# Patient Record
Sex: Male | Born: 1975 | Race: White | Hispanic: No | Marital: Single | Smoking: Never smoker
Health system: Southern US, Community
[De-identification: ages and names within clinical notes are randomized; demographics above are authoritative.]

## PROBLEM LIST (undated history)

## (undated) DIAGNOSIS — A630 Anogenital (venereal) warts: Secondary | ICD-10-CM

## (undated) DIAGNOSIS — I10 Essential (primary) hypertension: Secondary | ICD-10-CM

## (undated) DIAGNOSIS — E785 Hyperlipidemia, unspecified: Secondary | ICD-10-CM

## (undated) HISTORY — DX: Anogenital (venereal) warts: A63.0

## (undated) HISTORY — PX: CHOLECYSTECTOMY: SHX55

## (undated) HISTORY — DX: Essential (primary) hypertension: I10

## (undated) HISTORY — DX: Hyperlipidemia, unspecified: E78.5

---

## 2004-06-07 ENCOUNTER — Ambulatory Visit (HOSPITAL_COMMUNITY): Admission: RE | Admit: 2004-06-07 | Discharge: 2004-06-07 | Payer: Self-pay | Admitting: Orthopedic Surgery

## 2006-01-31 IMAGING — CR DG ORBITS FOR FOREIGN BODY
2 series · 2 of 2 positions shown · non-contrast
Comparison: none

CLINICAL DATA: history of working around metal.  Patient for MRI on 06/08/04
 ORBITS FOR FOREIGN BODY
 Two views of the orbits remain and show no evidence of metallic foreign body in the region of  the orbits or sinuses.
 IMPRESSION
 No metallic foreign body.

[view not recorded (1 of 2)]
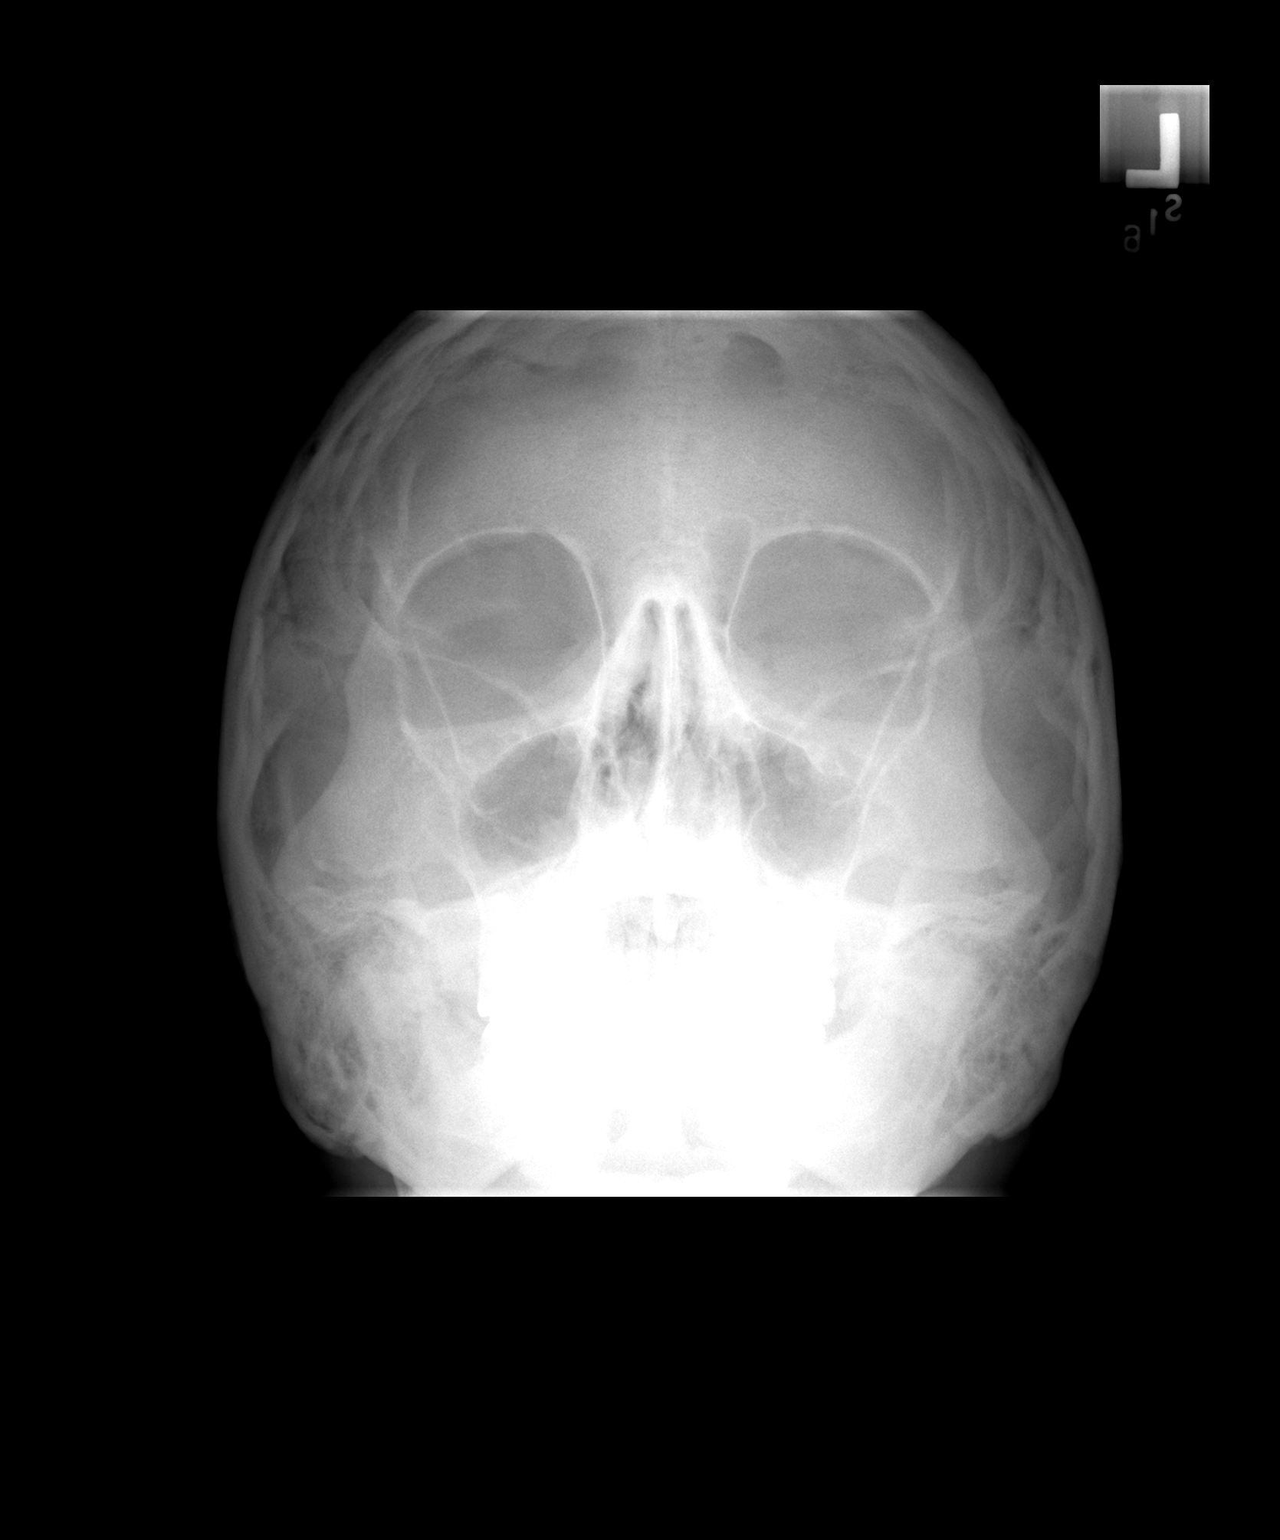

[view not recorded (2 of 2)]
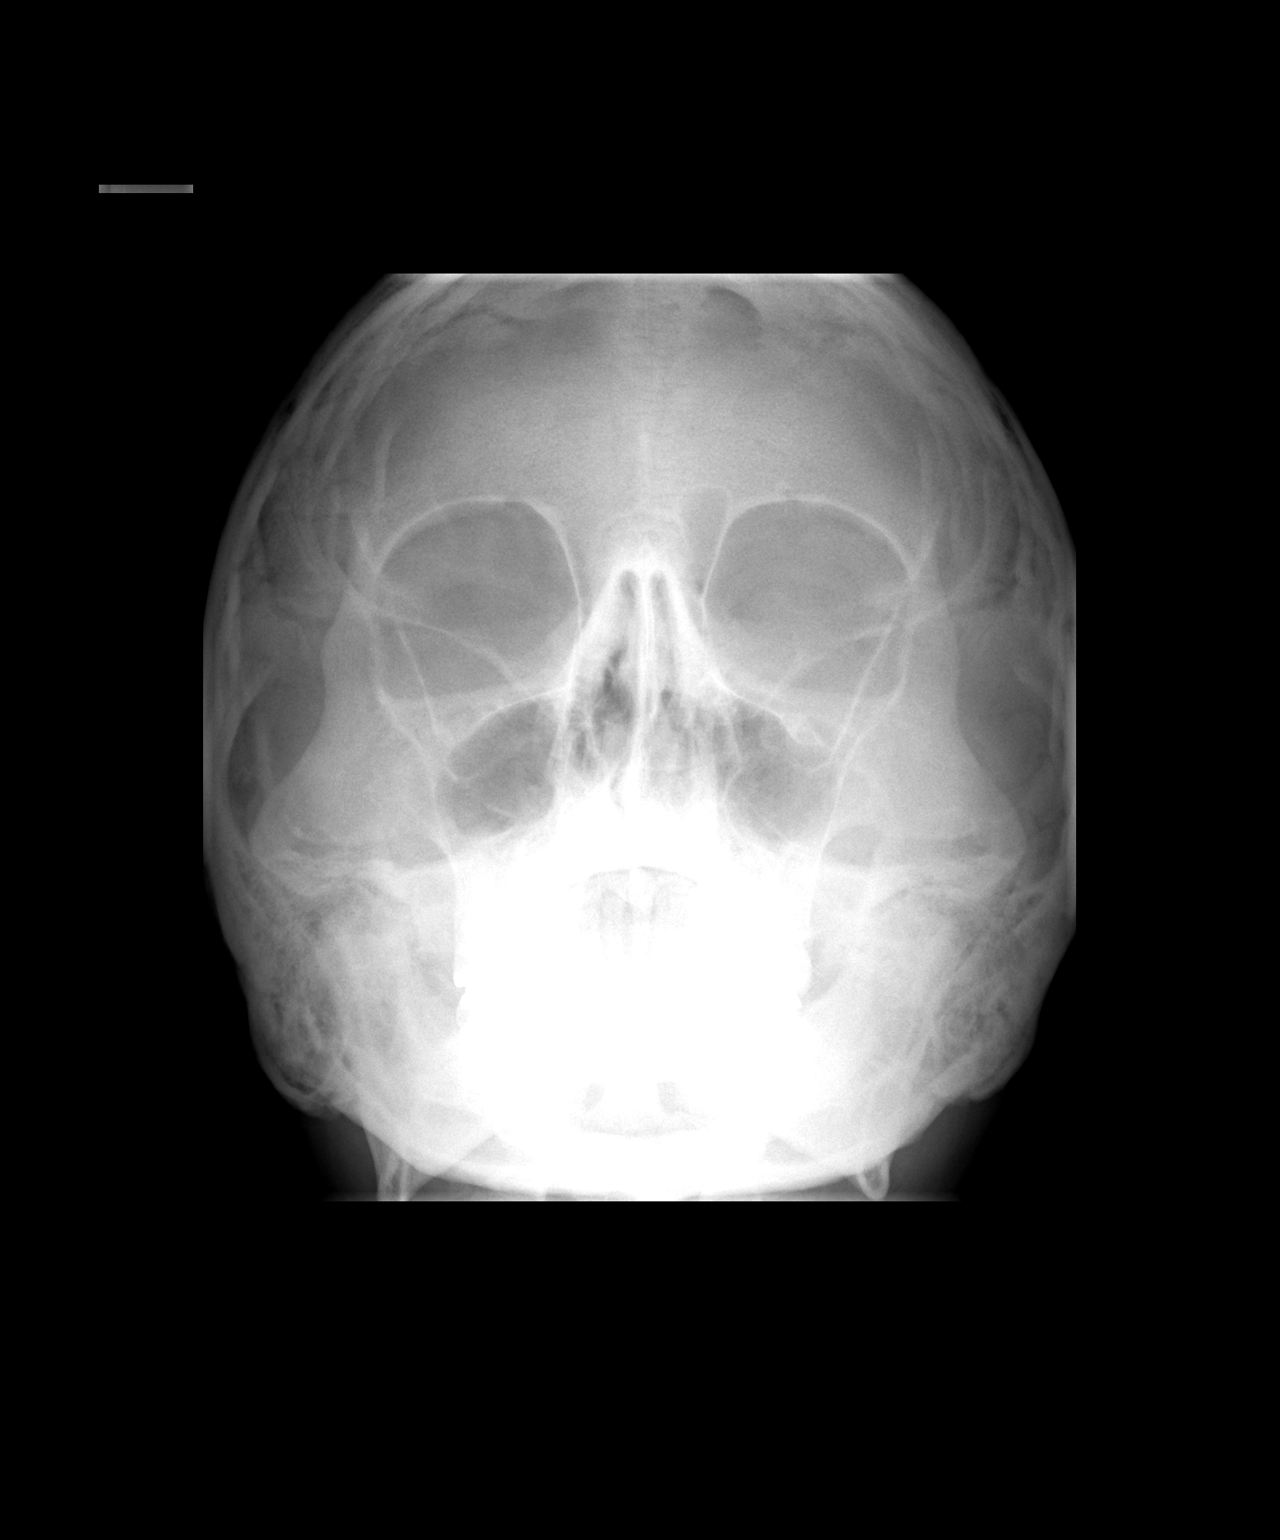

[2 of 2 positions shown; findings below may reference images not displayed]

## 2006-03-03 ENCOUNTER — Ambulatory Visit: Payer: Self-pay | Admitting: Family Medicine

## 2009-03-29 ENCOUNTER — Ambulatory Visit: Payer: Self-pay | Admitting: Family Medicine

## 2009-11-06 ENCOUNTER — Ambulatory Visit: Payer: Self-pay | Admitting: Family Medicine

## 2013-01-06 ENCOUNTER — Other Ambulatory Visit: Payer: Self-pay | Admitting: Family Medicine

## 2013-01-06 MED ORDER — TRIAMCINOLONE ACETONIDE 0.1 % MT PSTE: PASTE | OROMUCOSAL | Status: DC

## 2013-01-06 MED ORDER — DICLOFENAC SODIUM 1 % TD GEL: 4.0000 g | Freq: Four times a day (QID) | TRANSDERMAL | Status: DC

## 2013-09-01 ENCOUNTER — Encounter: Payer: Self-pay | Admitting: Family Medicine

## 2013-09-01 ENCOUNTER — Encounter (INDEPENDENT_AMBULATORY_CARE_PROVIDER_SITE_OTHER): Payer: Self-pay

## 2013-09-01 ENCOUNTER — Ambulatory Visit (INDEPENDENT_AMBULATORY_CARE_PROVIDER_SITE_OTHER): Payer: Private Health Insurance - Indemnity | Admitting: Family Medicine

## 2013-09-01 VITALS — BP 115/75 | HR 80 | Ht 67.0 in | Wt 176.0 lb

## 2013-09-01 DIAGNOSIS — N452 Orchitis: Secondary | ICD-10-CM

## 2013-09-01 DIAGNOSIS — N453 Epididymo-orchitis: Secondary | ICD-10-CM

## 2013-09-01 MED ORDER — DOXYCYCLINE HYCLATE 100 MG PO CAPS: 100.0000 mg | ORAL_CAPSULE | Freq: Two times a day (BID) | ORAL | Status: DC

## 2013-09-01 NOTE — Progress Notes (Signed)
   Subjective:    Patient ID: Luis Chase, male    DOB: 11-Nov-1975, 37 y.o.   MRN: [id number]  [name] is here today complaining of right testicular pain.    Testicle Pain The patient's primary symptoms include testicular pain. The patient's pertinent negatives include no genital injury, genital itching, genital lesions, penile discharge, penile pain or scrotal swelling. This is a new problem. The current episode started more than 1 month ago. The problem occurs daily. The problem has been waxing and waning. The pain is medium. Pertinent negatives include no abdominal pain, constipation, dysuria, fever, frequency, hesitancy, painful intercourse or urgency. The testicular pain affects the right testicle. Nothing aggravates the symptoms. He has tried nothing for the symptoms. He is sexually active. He never uses condoms. No, his partner does not have an STD. There is no history of chlamydia, a femoral hernia, gonorrhea, herpes simplex, kidney stones or prostatitis. (Hydrocele )    Review of Systems  Constitutional: Negative for fever.  Gastrointestinal: Negative for abdominal pain and constipation.  Genitourinary: Positive for testicular pain. Negative for dysuria, hesitancy, urgency, frequency, discharge, scrotal swelling and penile pain.  All other systems reviewed and are negative.     Past Medical History  Diagnosis Date  . Hyperlipidemia   . 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 Hypertension Father   . Hyperlipidemia Father   . Diabetes Maternal Uncle   . Diabetes Paternal Aunt   . Heart disease Paternal Grandmother   . Diabetes Paternal Grandfather   . Cancer Paternal Grandfather     Prostate Cancer  . Cancer Mother 23  . Heart disease Maternal Grandfather      No Known Allergies   Immunization History  Administered Date(s) Administered  . Tdap 05/19/2011      Objective:   Physical Exam  Nursing note and vitals reviewed. Constitutional: He appears well-nourished. No distress.  Abdominal: Hernia confirmed negative in the right inguinal area and confirmed negative in the left inguinal area.  Genitourinary: Penis normal.    Right testis shows tenderness. Left testis shows no tenderness. Circumcised. No penile erythema or penile tenderness. No discharge found.  Lymphadenopathy:       Right: No inguinal adenopathy present.       Left: No inguinal adenopathy present.      Assessment & Plan:    [name] was seen today for testicle pain.  Diagnoses and associated orders for this visit:  Epididymitis - doxycycline (VIBRAMYCIN) 100 MG capsule; Take 1 capsule (100 mg total) by mouth 2 (two) times daily.

## 2013-09-01 NOTE — Patient Instructions (Addendum)
1)  Testicular Pain - Take the doxycycline 100 mg twice a day for 10 days.  If this discomfort continues, contact Dr. Patsi Sears or another urologist at [facility] in [place].   Orchitis Orchitis is an infection of the testicle of usually sudden onset (happens quickly). It may be viral or bacterial (caused by germs). Usually with this illness there is generalized malaise (not feeling well) and fever. There is also pain. There is usually tenderness and swelling of the scrotum and testicle. DIAGNOSIS  Your caregiver will perform an exam to make sure there is not another reason for the pain in your testicle. A rectal exam may be done to find out if the prostate is swollen and tender. Blood work may be done to see if your white blood cell count is elevated. This can help determine if an infection is viral or bacterial. A urinalysis can also determine what type of infection is present. Most bacterial infections can be treated with antibiotics (medications which kill germs). LET YOUR CAREGIVER KNOW ABOUT:  Allergies.  Medications taken including herbs, eye drops, over the counter medications, and creams.  Use of steroids (by mouth or creams).  Previous problems with anesthetics or novocaine.  Previous prostate infections.  History of blood clots (thrombophlebitis).  History of bleeding or blood problems.  Previous surgery.  Previous urinary tract infection.  Other health problems. HOME CARE INSTRUCTIONS   Apply cold packs to the scrotal area for twenty minutes, four times per day or as needed.  A scrotal support may be helpful. Keep a small pillow or support under your testicles while lying or sitting down.  Only take over-the-counter or prescription medicines for pain, discomfort, or fever as directed by your caregiver.  Take all medications, including antibiotics, as directed. Take the antibiotics for the full prescribed length of time even if you are feeling better. SEEK IMMEDIATE  MEDICAL CARE IF:   Your redness, swelling, or pain in the testicle increases or is not getting better.  You have a fever.  You have pain not relieved with medicines.  You have any worsening of any symptoms (problems) that originally brought you in for medical care. Document Released: 09/12/2000 Document Revised: 12/08/2011 Document Reviewed: 09/15/2005 [facility] Patient Information 2014 [place], [place].                                                                                       n

## 2013-11-20 DIAGNOSIS — N452 Orchitis: Secondary | ICD-10-CM | POA: Insufficient documentation

## 2013-11-20 DIAGNOSIS — N451 Epididymitis: Secondary | ICD-10-CM | POA: Insufficient documentation

## 2013-12-22 ENCOUNTER — Other Ambulatory Visit: Payer: Self-pay | Admitting: *Deleted

## 2013-12-22 DIAGNOSIS — Z0000 Encounter for general adult medical examination without abnormal findings: Secondary | ICD-10-CM

## 2013-12-23 ENCOUNTER — Other Ambulatory Visit: Payer: Private Health Insurance - Indemnity

## 2013-12-23 LAB — COMPLETE METABOLIC PANEL WITH GFR
ALT: 21 U/L (ref 0–53)
AST: 17 U/L (ref 0–37)
Albumin: 4.5 g/dL (ref 3.5–5.2)
Alkaline Phosphatase: 57 U/L (ref 39–117)
BUN: 14 mg/dL (ref 6–23)
CO2: 26 mEq/L (ref 19–32)
Calcium: 9.3 mg/dL (ref 8.4–10.5)
Chloride: 102 mEq/L (ref 96–112)
Creat: 0.99 mg/dL (ref 0.50–1.35)
GFR, Est African American: >89 mL/min
GFR, Est Non African American: >89 mL/min
Glucose, Bld: 94 mg/dL (ref 70–99)
Potassium: 4.4 mEq/L (ref 3.5–5.3)
Sodium: 138 mEq/L (ref 135–145)
Total Bilirubin: 0.8 mg/dL (ref 0.2–1.2)
Total Protein: 7.3 g/dL (ref 6.0–8.3)

## 2013-12-23 LAB — CBC WITH DIFFERENTIAL/PLATELET
Basophils Absolute: 0.1 10*3/uL (ref 0.0–0.1)
Basophils Relative: 1 % (ref 0–1)
Eosinophils Absolute: 0.2 10*3/uL (ref 0.0–0.7)
Eosinophils Relative: 4 % (ref 0–5)
HCT: 45.5 % (ref 39.0–52.0)
Hemoglobin: 16.3 g/dL (ref 13.0–17.0)
Lymphocytes Relative: 33 % (ref 12–46)
Lymphs Abs: 1.8 10*3/uL (ref 0.7–4.0)
MCH: 31.9 pg (ref 26.0–34.0)
MCHC: 35.8 g/dL (ref 30.0–36.0)
MCV: 89 fL (ref 78.0–100.0)
Monocytes Absolute: 0.5 10*3/uL (ref 0.1–1.0)
Monocytes Relative: 9 % (ref 3–12)
Neutro Abs: 3 10*3/uL (ref 1.7–7.7)
Neutrophils Relative %: 53 % (ref 43–77)
Platelets: 171 10*3/uL (ref 150–400)
RBC: 5.11 MIL/uL (ref 4.22–5.81)
RDW: 12.7 % (ref 11.5–15.5)
WBC: 5.6 10*3/uL (ref 4.0–10.5)

## 2013-12-23 LAB — LIPID PANEL
Cholesterol: 183 mg/dL (ref 0–200)
HDL: 37 mg/dL — ABNORMAL LOW (ref >39)
LDL Cholesterol: 104 mg/dL — ABNORMAL HIGH (ref 0–99)
Total CHOL/HDL Ratio: 4.9 Ratio
Triglycerides: 209 mg/dL — ABNORMAL HIGH (ref <150)
VLDL: 42 mg/dL — ABNORMAL HIGH (ref 0–40)

## 2013-12-23 LAB — TSH: TSH: 1.219 u[IU]/mL (ref 0.350–4.500)

## 2013-12-24 LAB — PSA, TOTAL AND FREE
PSA, Free Pct: 38 % (ref >25)
PSA, Free: 0.32 ng/mL
PSA: 0.84 ng/mL (ref <=4.00)

## 2014-01-19 ENCOUNTER — Ambulatory Visit (INDEPENDENT_AMBULATORY_CARE_PROVIDER_SITE_OTHER): Payer: Private Health Insurance - Indemnity | Admitting: Family Medicine

## 2014-01-19 ENCOUNTER — Encounter: Payer: Self-pay | Admitting: Family Medicine

## 2014-01-19 VITALS — BP 129/97 | HR 91 | Resp 16 | Wt 180.0 lb

## 2014-01-19 DIAGNOSIS — R030 Elevated blood-pressure reading, without diagnosis of hypertension: Secondary | ICD-10-CM

## 2014-01-19 DIAGNOSIS — E785 Hyperlipidemia, unspecified: Secondary | ICD-10-CM

## 2014-01-19 DIAGNOSIS — R41840 Attention and concentration deficit: Secondary | ICD-10-CM

## 2014-01-19 DIAGNOSIS — IMO0001 Reserved for inherently not codable concepts without codable children: Secondary | ICD-10-CM

## 2014-01-19 NOTE — Patient Instructions (Addendum)
1)  Blood Pressure - Decrease your intake of sodium, alcohol and decongestants.  Check your BP 1-2 x per week.  F/U in a month.    2)  ADD - You scored many 2/3 so we'll try you on Vyvanse when your BP is better.  You may want to try some DHA 1000 mg in the meantime.     DASH Diet The DASH diet stands for "Dietary Approaches to Stop Hypertension." It is a healthy eating plan that has been shown to reduce high blood pressure (hypertension) in as little as 14 days, while also possibly providing other significant health benefits. These other health benefits include reducing the risk of breast cancer after menopause and reducing the risk of type 2 diabetes, heart disease, colon cancer, and stroke. Health benefits also include weight loss and slowing kidney failure in patients with chronic kidney disease.  DIET GUIDELINES  Limit salt (sodium). Your diet should contain less than 1500 mg of sodium daily.  Limit refined or processed carbohydrates. Your diet should include mostly whole grains. Desserts and added sugars should be used sparingly.  Include small amounts of heart-healthy fats. These types of fats include nuts, oils, and tub margarine. Limit saturated and trans fats. These fats have been shown to be harmful in the body. CHOOSING FOODS  The following food groups are based on a 2000 calorie diet. See your Registered Dietitian for individual calorie needs. Grains and Grain Products (6 to 8 servings daily)  Eat More Often: Whole-wheat bread, brown rice, whole-grain or wheat pasta, quinoa, popcorn without added fat or salt (air popped).  Eat Less Often: White bread, white pasta, white rice, cornbread. Vegetables (4 to 5 servings daily)  Eat More Often: Fresh, frozen, and canned vegetables. Vegetables may be raw, steamed, roasted, or grilled with a minimal amount of fat.  Eat Less Often/Avoid: Creamed or fried vegetables. Vegetables in a cheese sauce. Fruit (4 to 5 servings daily)  Eat More  Often: All fresh, canned (in natural juice), or frozen fruits. Dried fruits without added sugar. One hundred percent fruit juice ( cup [237 mL] daily).  Eat Less Often: Dried fruits with added sugar. Canned fruit in light or heavy syrup. [employer], Fish, and Poultry (2 servings or less daily. One serving is 3 to 4 oz [85-114 g]).  Eat More Often: Ninety percent or leaner ground beef, tenderloin, sirloin. Round cuts of beef, chicken breast, [place] breast. All fish. Grill, bake, or broil your meat. Nothing should be fried.  Eat Less Often/Avoid: Fatty cuts of meat, [place], or chicken leg, thigh, or wing. Fried cuts of meat or fish. Dairy (2 to 3 servings)  Eat More Often: Low-fat or fat-free milk, low-fat plain or light yogurt, reduced-fat or part-skim cheese.  Eat Less Often/Avoid: Milk (whole, 2%).Whole milk yogurt. Full-fat cheeses. Nuts, Seeds, and Legumes (4 to 5 servings per week)  Eat More Often: All without added salt.  Eat Less Often/Avoid: Salted nuts and seeds, canned beans with added salt. Fats and Sweets (limited)  Eat More Often: Vegetable oils, tub margarines without trans fats, sugar-free gelatin. Mayonnaise and salad dressings.  Eat Less Often/Avoid: Coconut oils, palm oils, butter, stick margarine, cream, half and half, cookies, candy, pie. FOR MORE INFORMATION The Dash Diet Eating Plan: www.dashdiet.org Document Released: 09/04/2011 Document Revised: 12/08/2011 Document Reviewed: 09/04/2011 [facility] Patient Information 2014 [place], [place].   Attention Deficit Hyperactivity Disorder Attention deficit hyperactivity disorder (ADHD) is a problem with behavior issues based on the way the brain  functions (neurobehavioral disorder). It is a common reason for behavior and academic problems in school. SYMPTOMS  There are 3 types of ADHD. The 3 types and some of the symptoms include:  Inattentive  Gets bored or distracted easily.  Loses or forgets things. Forgets to  hand in homework.  Has trouble organizing or completing tasks.  Difficulty staying on task.  An inability to organize daily tasks and school work.  Leaving projects, chores, or homework unfinished.  Trouble paying attention or responding to details. Careless mistakes.  Difficulty following directions. Often seems like is not listening.  Dislikes activities that require sustained attention (like chores or homework).  Hyperactive-impulsive  Feels like it is impossible to sit still or stay in a seat. Fidgeting with hands and feet.  Trouble waiting turn.  Talking too much or out of turn. Interruptive.  Speaks or acts impulsively.  Aggressive, disruptive behavior.  Constantly busy or on the go, noisy.  Often leaves seat when they are expected to remain seated.  Often runs or climbs where it is not appropriate, or feels very restless.  Combined  Has symptoms of both of the above. Often children with ADHD feel discouraged about themselves and with school. They often perform well below their abilities in school. As children get older, the excess motor activities can calm down, but the problems with paying attention and staying organized persist. Most children do not outgrow ADHD but with good treatment can learn to cope with the symptoms. DIAGNOSIS  When ADHD is suspected, the diagnosis should be made by professionals trained in ADHD. This professional will collect information about the individual suspected of having ADHD. Information must be collected from various settings where the person lives, works, or attends school.  Diagnosis will include:  Confirming symptoms began in childhood.  Ruling out other reasons for the child's behavior.  The health care providers will check with the child's school and check their medical records.  They will talk to teachers and parents.  Behavior rating scales for the child will be filled out by those dealing with the child on a daily  basis. A diagnosis is made only after all information has been considered. TREATMENT  Treatment usually includes behavioral treatment, tutoring or extra support in school, and stimulant medicines. Because of the way a person's brain works with ADHD, these medicines decrease impulsivity and hyperactivity and increase attention. This is different than how they would work in a person who does not have ADHD. Other medicines used include antidepressants and certain blood pressure medicines. Most experts agree that treatment for ADHD should address all aspects of the person's functioning. Along with medicines, treatment should include structured classroom management at school. Parents should reward good behavior, provide constant discipline, and limit-setting. Tutoring should be available for the child as needed. ADHD is a life-long condition. If untreated, the disorder can have long-term serious effects into adolescence and adulthood. HOME CARE INSTRUCTIONS   Often with ADHD there is a lot of frustration among family members dealing with the condition. Blame and anger are also feelings that are common. In many cases, because the problem affects the family as a whole, the entire family may need help. A therapist can help the family find better ways to handle the disruptive behaviors of the person with ADHD and promote change. If the person with ADHD is young, most of the therapist's work is with the parents. Parents will learn techniques for coping with and improving their child's behavior. Sometimes only the child  with the ADHD needs counseling. Your health care providers can help you make these decisions.  Children with ADHD may need help learning how to organize. Some helpful tips include:  Keep routines the same every day from wake-up time to bedtime. Schedule all activities, including homework and playtime. Keep the schedule in a place where the person with ADHD will often see it. Mark schedule changes as  far in advance as possible.  Schedule outdoor and indoor recreation.  Have a place for everything and keep everything in its place. This includes clothing, backpacks, and school supplies.  Encourage writing down assignments and bringing home needed books. Work with your child's teachers for assistance in organizing school work.  Offer your child a well-balanced diet. Breakfast that includes a balance of whole grains, protein and, fruits or vegetables is especially important for school performance. Children should avoid drinks with caffeine including:  Soft drinks.  Coffee.  Tea.  However, some older children (adolescents) may find these drinks helpful in improving their attention. Because it can also be common for adolescents with ADHD to become addicted to caffeine, talk with your health care provider about what is a safe amount of caffeine intake for your child.  Children with ADHD need consistent rules that they can understand and follow. If rules are followed, give small rewards. Children with ADHD often receive, and expect, criticism. Look for good behavior and praise it. Set realistic goals. Give clear instructions. Look for activities that can foster success and self-esteem. Make time for pleasant activities with your child. Give lots of affection.  Parents are their children's greatest advocates. Learn as much as possible about ADHD. This helps you become a stronger and better advocate for your child. It also helps you educate your child's teachers and instructors if they feel inadequate in these areas. Parent support groups are often helpful. A national group with local chapters is called Children and Adults with Attention Deficit Hyperactivity Disorder (CHADD). SEEK MEDICAL CARE IF:  Your child has repeated muscle twitches, cough or speech outbursts.  Your child has sleep problems.  Your child has a marked loss of appetite.  Your child develops depression.  Your child has new  or worsening behavioral problems.  Your child develops dizziness.  Your child has a racing heart.  Your child has stomach pains.  Your child develops headaches. SEEK IMMEDIATE MEDICAL CARE IF:  Your child has been diagnosed with depression or anxiety and the symptoms seem to be getting worse.  Your child has been depressed and suddenly appears to have increased energy or motivation.  You are worried that your child is having a bad reaction to a medication he or she is taking for ADHD. Document Released: 09/05/2002 Document Revised: 07/06/2013 Document Reviewed: 05/23/2013 [facility] Patient Information 2014 [place].

## 2014-01-19 NOTE — Progress Notes (Signed)
Subjective:    Patient ID: Luis Chase, male    DOB: 22-Feb-1976, 38 y.o.   MRN: [id number]  HPI  Luis Chase is here today for follow up on his lab results.  He has done well since his last office visit.  His only concern today is his concentration.    Review of Systems  Constitutional: Negative for fatigue and unexpected weight change.  HENT: Negative.   Respiratory: Negative for shortness of breath.   Cardiovascular: Negative for chest pain, palpitations and leg swelling.  Gastrointestinal: Negative.   Genitourinary: Negative.   Musculoskeletal: Negative for myalgias.  Skin: Negative.   Neurological: Negative.   Psychiatric/Behavioral: Positive for decreased concentration. The patient is not hyperactive.    Past Medical History  Diagnosis Date  . Hyperlipidemia   . 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 Hypertension Father   . Hyperlipidemia Father   . Diabetes Maternal Uncle   . Diabetes Paternal Aunt   . Heart disease Paternal Grandmother   . Diabetes Paternal Grandfather   . Cancer Paternal Grandfather     Prostate Cancer  . Cancer Mother 30  . Heart disease Maternal Grandfather      No current outpatient prescriptions on file prior to visit.   No current facility-administered medications on file prior to visit.     No Known Allergies   Immunization History    Administered Date(s) Administered  . Tdap 05/19/2011       Objective:   Physical Exam  Constitutional: He is oriented to person, place, and time. He appears well-nourished. No distress.  HENT:  Head: Normocephalic.  Eyes: No scleral icterus.  Neck: Neck supple. No thyromegaly present.  Cardiovascular: Normal rate, regular rhythm and normal heart sounds.  Exam reveals no gallop and no friction rub.   No murmur heard. Pulmonary/Chest: Breath sounds normal. No respiratory distress. He exhibits no tenderness.  Musculoskeletal: He exhibits no edema.  Neurological: He is alert and oriented to person, place, and time.  Skin: Skin is warm and dry. No rash noted.  Psychiatric: He has a normal mood and affect. His behavior is normal. Judgment and thought content normal.      Assessment & Plan:    Luis Chase was seen today for concentration.  Diagnoses and associated orders for this visit:  Concentration deficit Comments: He completed the Adult ADD form and scored mostly 3's.  We discussed him trying Vyvanse.  He will need to work on getting his BP lower.    Elevated BP Comments: His BP was high on 2 separate checks.  We discussed ways for him to lower his pressure.  He'll have [name] check it over the next month.    Other and unspecified hyperlipidemia Comments: Overall, his lipid  panel is improved.     TIME SPENT "FACE TO FACE" WITH PATIENT - 30  MINS

## 2014-02-09 ENCOUNTER — Ambulatory Visit (INDEPENDENT_AMBULATORY_CARE_PROVIDER_SITE_OTHER): Payer: Private Health Insurance - Indemnity | Admitting: Family Medicine

## 2014-02-09 ENCOUNTER — Encounter: Payer: Self-pay | Admitting: Family Medicine

## 2014-02-09 VITALS — BP 122/85 | HR 78 | Resp 16 | Ht 68.0 in | Wt 173.0 lb

## 2014-02-09 DIAGNOSIS — R41840 Attention and concentration deficit: Secondary | ICD-10-CM

## 2014-02-09 DIAGNOSIS — Z0000 Encounter for general adult medical examination without abnormal findings: Secondary | ICD-10-CM

## 2014-02-09 LAB — POCT URINALYSIS DIPSTICK
Bilirubin, UA: NEGATIVE
Blood, UA: NEGATIVE
Glucose, UA: NEGATIVE
Ketones, UA: NEGATIVE
Leukocytes, UA: NEGATIVE
Nitrite, UA: NEGATIVE
Protein, UA: NEGATIVE
Spec Grav, UA: <=1.005
Urobilinogen, UA: NEGATIVE
pH, UA: 5

## 2014-02-09 MED ORDER — LISDEXAMFETAMINE DIMESYLATE 50 MG PO CAPS
50.0000 mg | ORAL_CAPSULE | Freq: Every day | ORAL | Status: DC
Start: 2014-02-09 — End: 2014-03-07

## 2014-02-09 NOTE — Progress Notes (Signed)
Subjective:    Patient ID: Luis Chase, male    DOB: 1975-12-19, 38 y.o.   MRN: [id number]  HPI  Luis Chase is here today for a CPE.  He has been doing well since his last visit.   Review of Systems  Constitutional: Negative for activity change, appetite change and fatigue.  Respiratory: Negative for cough.   Cardiovascular: Negative for chest pain, palpitations and leg swelling.  Gastrointestinal: Negative for constipation and anal bleeding.  Psychiatric/Behavioral: Negative for behavioral problems and sleep disturbance. The patient is not nervous/anxious.   All other systems reviewed and are negative.    Past Medical History  Diagnosis Date  . Hyperlipidemia   . 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 Hypertension Father   . Hyperlipidemia Father   . Diabetes Maternal Uncle   . Diabetes Paternal Aunt   . Heart disease Paternal Grandmother   . Diabetes Paternal Grandfather   . Cancer Paternal Grandfather     Prostate   . Cancer Mother 48    Lung (Smoker)   . Heart disease Maternal Grandfather   . Cancer Maternal Grandfather     Prostate    No Known Allergies   Immunization History  Administered Date(s) Administered  . Tdap 05/19/2011       Objective:   Physical Exam  Nursing note and vitals reviewed. Constitutional: He is oriented to person, place, and time. He  appears well-developed and well-nourished. No distress.  HENT:  Head: Normocephalic and atraumatic.  Right Ear: External ear normal.  Left Ear: External ear normal.  Nose: Nose normal.  Mouth/Throat: Oropharynx is clear and moist. No oropharyngeal exudate.  Eyes: Conjunctivae and EOM are normal. Pupils are equal, round, and reactive to light. No scleral icterus.  Neck: Normal range of motion. Neck supple. No thyromegaly present.  Cardiovascular: Normal rate, regular rhythm, normal heart sounds and intact distal pulses.   No murmur heard. Pulmonary/Chest: Effort normal and breath sounds normal.  Abdominal: Soft. Bowel sounds are normal. He exhibits no mass. There is no tenderness. Hernia confirmed negative in the right inguinal area and confirmed negative in the left inguinal area.  Genitourinary: Testes normal and penis normal. Circumcised.  Musculoskeletal: Normal range of motion. He exhibits no edema and no tenderness.  Lymphadenopathy:    He has no cervical adenopathy.       Right: No inguinal adenopathy present.       Left: No inguinal adenopathy present.  Neurological: He is alert and oriented to person, place, and time. He has normal reflexes.  Skin: Skin is warm and dry. No rash noted.  Psychiatric: He has a normal mood and affect. His behavior is normal. Judgment and thought content normal.  Assessment & Plan:    Yassin was seen today for annual exam.  Diagnoses and associated orders for this visit:  Routine general medical examination at a health care facility - POCT urinalysis dipstick  Concentration deficit -        lisdexamfetamine (VYVANSE) 50 MG capsule; Take 1 capsule (50 mg total) by mouth daily.

## 2014-02-09 NOTE — Patient Instructions (Addendum)
1)  Preventative - Complete and return the stool cards.  2)  ADD - Start on 25 mg of Vyvanse for 4 days then increase to 50 mg.  F/U in one month for a recheck.      Preventive Care for Adults, Male A healthy lifestyle and preventive care can promote health and wellness. Preventive health guidelines for men include the following key practices:  A routine yearly physical is a good way to check with your health care provider about your health and preventative screening. It is a chance to share any concerns and updates on your health and to receive a thorough exam.  Visit your dentist for a routine exam and preventative care every 6 months. Brush your teeth twice a day and floss once a day. Good oral hygiene prevents tooth decay and gum disease.  The frequency of eye exams is based on your age, health, family medical history, use of contact lenses, and other factors. Follow your health care provider's recommendations for frequency of eye exams.  Eat a healthy diet. Foods such as vegetables, fruits, whole grains, low-fat dairy products, and lean protein foods contain the nutrients you need without too many calories. Decrease your intake of foods high in solid fats, added sugars, and salt. Eat the right amount of calories for you.Get information about a proper diet from your health care provider, if necessary.  Regular physical exercise is one of the most important things you can do for your health. Most adults should get at least 150 minutes of moderate-intensity exercise (any activity that increases your heart rate and causes you to sweat) each week. In addition, most adults need muscle-strengthening exercises on 2 or more days a week.  Maintain a healthy weight. The body mass index (BMI) is a screening tool to identify possible weight problems. It provides an estimate of body fat based on height and weight. Your health care provider can find your BMI and can help you achieve or maintain a healthy  weight.For adults 20 years and older:  A BMI below 18.5 is considered underweight.  A BMI of 18.5 to 24.9 is normal.  A BMI of 25 to 29.9 is considered overweight.  A BMI of 30 and above is considered obese.  Maintain normal blood lipids and cholesterol levels by exercising and minimizing your intake of saturated fat. Eat a balanced diet with plenty of fruit and vegetables. Blood tests for lipids and cholesterol should begin at age 67 and be repeated every 5 years. If your lipid or cholesterol levels are high, you are over 50, or you are at high risk for heart disease, you may need your cholesterol levels checked more frequently.Ongoing high lipid and cholesterol levels should be treated with medicines if diet and exercise are not working.  If you smoke, find out from your health care provider how to quit. If you do not use tobacco, do not start.  Lung cancer screening is recommended for adults aged 73 80 years who are at high risk for developing lung cancer because of a history of smoking. A yearly low-dose CT scan of the lungs is recommended for people who have at least a 30-pack-year history of smoking and are a current smoker or have quit within the past 15 years. A pack year of smoking is smoking an average of 1 pack of cigarettes a day for 1 year (for example: 1 pack a day for 30 years or 2 packs a day for 15 years). Yearly screening should continue  until the smoker has stopped smoking for at least 15 years. Yearly screening should be stopped for people who develop a health problem that would prevent them from having lung cancer treatment.  If you choose to drink alcohol, do not have more than 2 drinks per day. One drink is considered to be 12 ounces (355 mL) of beer, 5 ounces (148 mL) of wine, or 1.5 ounces (44 mL) of liquor.  Avoid use of street drugs. Do not share needles with anyone. Ask for help if you need support or instructions about stopping the use of drugs.  High blood pressure  causes heart disease and increases the risk of stroke. Your blood pressure should be checked at least every 1 2 years. Ongoing high blood pressure should be treated with medicines, if weight loss and exercise are not effective.  If you are 19 40 years old, ask your health care provider if you should take aspirin to prevent heart disease.  Diabetes screening involves taking a blood sample to check your fasting blood sugar level. This should be done once every 3 years, after age 72, if you are within normal weight and without risk factors for diabetes. Testing should be considered at a younger age or be carried out more frequently if you are overweight and have at least 1 risk factor for diabetes.  Colorectal cancer can be detected and often prevented. Most routine colorectal cancer screening begins at the age of 64 and continues through age 93. However, your health care provider may recommend screening at an earlier age if you have risk factors for colon cancer. On a yearly basis, your health care provider may provide home test kits to check for hidden blood in the stool. Use of a small camera at the end of a tube to directly examine the colon (sigmoidoscopy or colonoscopy) can detect the earliest forms of colorectal cancer. Talk to your health care provider about this at age 5, when routine screening begins. Direct exam of the colon should be repeated every 5 10 years through age 83, unless early forms of precancerous polyps or small growths are found.  People who are at an increased risk for hepatitis B should be screened for this virus. You are considered at high risk for hepatitis B if:  You were born in a country where hepatitis B occurs often. Talk with your health care provider about which countries are considered high-risk.  Your parents were born in a high-risk country and you have not received a shot to protect against hepatitis B (hepatitis B vaccine).  You have HIV or AIDS.  You use  needles to inject street drugs.  You live with, or have sex with, someone who has hepatitis B.  You are a man who has sex with other men (MSM).  You get hemodialysis treatment.  You take certain medicines for conditions such as cancer, organ transplantation, and autoimmune conditions.  Hepatitis C blood testing is recommended for all people born from [birth year] through 1965 and any individual with known risks for hepatitis C.  Practice safe sex. Use condoms and avoid high-risk sexual practices to reduce the spread of sexually transmitted infections (STIs). STIs include gonorrhea, chlamydia, syphilis, trichomonas, herpes, HPV, and human immunodeficiency virus (HIV). Herpes, HIV, and HPV are viral illnesses that have no cure. They can result in disability, cancer, and death.  A one-time screening for abdominal aortic aneurysm (AAA) and surgical repair of large AAAs by ultrasound are recommended for men ages 4 to 55  years who are current or former smokers.  Healthy men should no longer receive prostate-specific antigen (PSA) blood tests as part of routine cancer screening. Talk with your health care provider about prostate cancer screening.  Testicular cancer screening is not recommended for adult males who have no symptoms. Screening includes self-exam, a health care provider exam, and other screening tests. Consult with your health care provider about any symptoms you have or any concerns you have about testicular cancer.  Use sunscreen. Apply sunscreen liberally and repeatedly throughout the day. You should seek shade when your shadow is shorter than you. Protect yourself by wearing long sleeves, pants, a wide-brimmed hat, and sunglasses year round, whenever you are outdoors.  Once a month, do a whole-body skin exam, using a mirror to look at the skin on your back. Tell your health care provider about new moles, moles that have irregular borders, moles that are larger than a pencil eraser, or moles  that have changed in shape or color.  Stay current with required vaccines (immunizations).  Influenza vaccine. All adults should be immunized every year.  Tetanus, diphtheria, and acellular pertussis (Td, Tdap) vaccine. An adult who has not previously received Tdap or who does not know his vaccine status should receive 1 dose of Tdap. This initial dose should be followed by tetanus and diphtheria toxoids (Td) booster doses every 10 years. Adults with an unknown or incomplete history of completing a 3-dose immunization series with Td-containing vaccines should begin or complete a primary immunization series including a Tdap dose. Adults should receive a Td booster every 10 years.  Varicella vaccine. An adult without evidence of immunity to varicella should receive 2 doses or a second dose if he has previously received 1 dose.  Human papillomavirus (HPV) vaccine. Males aged 33 21 years who have not received the vaccine previously should receive the 3-dose series. Males aged 41 26 years may be immunized. Immunization is recommended through the age of 36 years for any male who has sex with males and did not get any or all doses earlier. Immunization is recommended for any person with an immunocompromised condition through the age of 59 years if he did not get any or all doses earlier. During the 3-dose series, the second dose should be obtained 4 8 weeks after the first dose. The third dose should be obtained 24 weeks after the first dose and 16 weeks after the second dose.  Zoster vaccine. One dose is recommended for adults aged 59 years or older unless certain conditions are present.  Measles, mumps, and rubella (MMR) vaccine. Adults born before [birth year] generally are considered immune to measles and mumps. Adults born in [birth year] or later should have 1 or more doses of MMR vaccine unless there is a contraindication to the vaccine or there is laboratory evidence of immunity to each of the three diseases. A  routine second dose of MMR vaccine should be obtained at least 28 days after the first dose for students attending postsecondary schools, health care workers, or international travelers. People who received inactivated measles vaccine or an unknown type of measles vaccine during 1963 1967 should receive 2 doses of MMR vaccine. People who received inactivated mumps vaccine or an unknown type of mumps vaccine before 1979 and are at high risk for mumps infection should consider immunization with 2 doses of MMR vaccine. Unvaccinated health care workers born before [birth year] who lack laboratory evidence of measles, mumps, or rubella immunity or laboratory confirmation of disease should consider  measles and mumps immunization with 2 doses of MMR vaccine or rubella immunization with 1 dose of MMR vaccine.  Pneumococcal 13-valent conjugate (PCV13) vaccine. When indicated, a person who is uncertain of his immunization history and has no record of immunization should receive the PCV13 vaccine. An adult aged 35 years or older who has certain medical conditions and has not been previously immunized should receive 1 dose of PCV13 vaccine. This PCV13 should be followed with a dose of pneumococcal polysaccharide (PPSV23) vaccine. The PPSV23 vaccine dose should be obtained at least 8 weeks after the dose of PCV13 vaccine. An adult aged 14 years or older who has certain medical conditions and previously received 1 or more doses of PPSV23 vaccine should receive 1 dose of PCV13. The PCV13 vaccine dose should be obtained 1 or more years after the last PPSV23 vaccine dose.  Pneumococcal polysaccharide (PPSV23) vaccine. When PCV13 is also indicated, PCV13 should be obtained first. All adults aged 24 years and older should be immunized. An adult younger than age 20 years who has certain medical conditions should be immunized. Any person who resides in a nursing home or long-term care facility should be immunized. An adult smoker should be  immunized. People with an immunocompromised condition and certain other conditions should receive both PCV13 and PPSV23 vaccines. People with human immunodeficiency virus (HIV) infection should be immunized as soon as possible after diagnosis. Immunization during chemotherapy or radiation therapy should be avoided. Routine use of PPSV23 vaccine is not recommended for American Indians, [place] Natives, or people younger than 65 years unless there are medical conditions that require PPSV23 vaccine. When indicated, people who have unknown immunization and have no record of immunization should receive PPSV23 vaccine. One-time revaccination 5 years after the first dose of PPSV23 is recommended for people aged 20 64 years who have chronic kidney failure, nephrotic syndrome, asplenia, or immunocompromised conditions. People who received 1 2 doses of PPSV23 before age 34 years should receive another dose of PPSV23 vaccine at age 11 years or later if at least 5 years have passed since the previous dose. Doses of PPSV23 are not needed for people immunized with PPSV23 at or after age 55 years.  Meningococcal vaccine. Adults with asplenia or persistent complement component deficiencies should receive 2 doses of quadrivalent meningococcal conjugate (MenACWY-D) vaccine. The doses should be obtained at least 2 months apart. Microbiologists working with certain meningococcal bacteria, [place] recruits, people at risk during an outbreak, and people who travel to or live in countries with a high rate of meningitis should be immunized. A first-year college student up through age 57 years who is living in a residence hall should receive a dose if he did not receive a dose on or after his 16th birthday. Adults who have certain high-risk conditions should receive one or more doses of vaccine.  Hepatitis A vaccine. Adults who wish to be protected from this disease, have certain high-risk conditions, work with hepatitis A-infected  animals, work in hepatitis A research labs, or travel to or work in countries with a high rate of hepatitis A should be immunized. Adults who were previously unvaccinated and who anticipate close contact with an international adoptee during the first 60 days after arrival in the [place] from a country with a high rate of hepatitis A should be immunized.  Hepatitis B vaccine. Adults who wish to be protected from this disease, have certain high-risk conditions, may be exposed to blood or other infectious body fluids, are household  contacts or sex partners of hepatitis B positive people, are clients or workers in certain care facilities, or travel to or work in countries with a high rate of hepatitis B should be immunized.  Haemophilus influenzae type b (Hib) vaccine. A previously unvaccinated person with asplenia or sickle cell disease or having a scheduled splenectomy should receive 1 dose of Hib vaccine. Regardless of previous immunization, a recipient of a hematopoietic stem cell transplant should receive a 3-dose series 6 12 months after his successful transplant. Hib vaccine is not recommended for adults with HIV infection. Preventive Service / Frequency Ages 60 to 74  Blood pressure check.** / Every 1 to 2 years.  Lipid and cholesterol check.** / Every 5 years beginning at age 45.  Hepatitis C blood test.** / For any individual with known risks for hepatitis C.  Skin self-exam. / Monthly.  Influenza vaccine. / Every year.  Tetanus, diphtheria, and acellular pertussis (Tdap, Td) vaccine.** / Consult your health care provider. 1 dose of Td every 10 years.  Varicella vaccine.** / Consult your health care provider.  HPV vaccine. / 3 doses over 6 months, if 9 or younger.  Measles, mumps, rubella (MMR) vaccine.** / You need at least 1 dose of MMR if you were born in 1957 or later. You may also need a second dose.  Pneumococcal 13-valent conjugate (PCV13) vaccine.** / Consult your  health care provider.  Pneumococcal polysaccharide (PPSV23) vaccine.** / 1 to 2 doses if you smoke cigarettes or if you have certain conditions.  Meningococcal vaccine.** / 1 dose if you are age 58 to 25 years and a Market researcher living in a residence hall, or have one of several medical conditions. You may also need additional booster doses.  Hepatitis A vaccine.** / Consult your health care provider.  Hepatitis B vaccine.** / Consult your health care provider.  Haemophilus influenzae type b (Hib) vaccine.** / Consult your health care provider. Ages 65 to 62  Blood pressure check.** / Every 1 to 2 years.  Lipid and cholesterol check.** / Every 5 years beginning at age 34.  Lung cancer screening. / Every year if you are aged 52 80 years and have a 30-pack-year history of smoking and currently smoke or have quit within the past 15 years. Yearly screening is stopped once you have quit smoking for at least 15 years or develop a health problem that would prevent you from having lung cancer treatment.  Fecal occult blood test (FOBT) of stool. / Every year beginning at age 63 and continuing until age 63. You may not have to do this test if you get a colonoscopy every 10 years.  Flexible sigmoidoscopy** or colonoscopy.** / Every 5 years for a flexible sigmoidoscopy or every 10 years for a colonoscopy beginning at age 21 and continuing until age 89.  Hepatitis C blood test.** / For all people born from [birth year] through 1965 and any individual with known risks for hepatitis C.  Skin self-exam. / Monthly.  Influenza vaccine. / Every year.  Tetanus, diphtheria, and acellular pertussis (Tdap/Td) vaccine.** / Consult your health care provider. 1 dose of Td every 10 years.  Varicella vaccine.** / Consult your health care provider.  Zoster vaccine.** / 1 dose for adults aged 57 years or older.  Measles, mumps, rubella (MMR) vaccine.** / You need at least 1 dose of MMR if you were born in  1959 or later. You may also need a second dose.  Pneumococcal 13-valent conjugate (PCV13) vaccine.** / Consult  your health care provider.  Pneumococcal polysaccharide (PPSV23) vaccine.** / 1 to 2 doses if you smoke cigarettes or if you have certain conditions.  Meningococcal vaccine.** / Consult your health care provider.  Hepatitis A vaccine.** / Consult your health care provider.  Hepatitis B vaccine.** / Consult your health care provider.  Haemophilus influenzae type b (Hib) vaccine.** / Consult your health care provider. Ages 21 and over  Blood pressure check.** / Every 1 to 2 years.  Lipid and cholesterol check.**/ Every 5 years beginning at age 6.  Lung cancer screening. / Every year if you are aged 10 80 years and have a 30-pack-year history of smoking and currently smoke or have quit within the past 15 years. Yearly screening is stopped once you have quit smoking for at least 15 years or develop a health problem that would prevent you from having lung cancer treatment.  Fecal occult blood test (FOBT) of stool. / Every year beginning at age 107 and continuing until age 30. You may not have to do this test if you get a colonoscopy every 10 years.  Flexible sigmoidoscopy** or colonoscopy.** / Every 5 years for a flexible sigmoidoscopy or every 10 years for a colonoscopy beginning at age 93 and continuing until age 12.  Hepatitis C blood test.** / For all people born from [birth year] through 1967 and any individual with known risks for hepatitis C.  Abdominal aortic aneurysm (AAA) screening.** / A one-time screening for ages 54 to 50 years who are current or former smokers.  Skin self-exam. / Monthly.  Influenza vaccine. / Every year.  Tetanus, diphtheria, and acellular pertussis (Tdap/Td) vaccine.** / 1 dose of Td every 10 years.  Varicella vaccine.** / Consult your health care provider.  Zoster vaccine.** / 1 dose for adults aged 52 years or older.  Pneumococcal 13-valent  conjugate (PCV13) vaccine.** / Consult your health care provider.  Pneumococcal polysaccharide (PPSV23) vaccine.** / 1 dose for all adults aged 55 years and older.  Meningococcal vaccine.** / Consult your health care provider.  Hepatitis A vaccine.** / Consult your health care provider.  Hepatitis B vaccine.** / Consult your health care provider.  Haemophilus influenzae type b (Hib) vaccine.** / Consult your health care provider. **Family history and personal history of risk and conditions may change your health care provider's recommendations. Document Released: 11/11/2001 Document Revised: 07/06/2013 Document Reviewed: 02/10/2011 [facility] Patient Information 2014 [place], [place].

## 2014-03-07 ENCOUNTER — Ambulatory Visit (INDEPENDENT_AMBULATORY_CARE_PROVIDER_SITE_OTHER): Payer: Private Health Insurance - Indemnity | Admitting: Family Medicine

## 2014-03-07 ENCOUNTER — Encounter: Payer: Self-pay | Admitting: Family Medicine

## 2014-03-07 VITALS — BP 121/85 | HR 97 | Resp 16 | Ht 67.0 in | Wt 172.0 lb

## 2014-03-07 DIAGNOSIS — R41840 Attention and concentration deficit: Secondary | ICD-10-CM

## 2014-03-07 MED ORDER — LISDEXAMFETAMINE DIMESYLATE 50 MG PO CAPS: 50.0000 mg | ORAL_CAPSULE | Freq: Every day | ORAL | Status: DC

## 2014-03-07 NOTE — Progress Notes (Signed)
   Subjective:    Patient ID: Luis Chase, male    DOB: [birth year], 38 y.o.   MRN: [id number]  HPI  Luis Chase is here today to follow up on his concentration.  He started taking Vyvanse 50 mg last month and he feels that it has worked very well for him.  He is eating and sleeping well. He would like to continue on the current dosage.    Review of Systems  Constitutional: Negative for activity change, appetite change and fatigue.  Psychiatric/Behavioral: Negative for behavioral problems, sleep disturbance and decreased concentration. The patient is not nervous/anxious.   All other systems reviewed and are negative.    Past Medical History  Diagnosis Date  . Hyperlipidemia   . 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 Hypertension Father   . Hyperlipidemia Father   . Diabetes Maternal Uncle   . Diabetes Paternal Aunt   . Heart disease Paternal Grandmother   . Diabetes Paternal Grandfather   . Cancer Paternal Grandfather     Prostate   . Cancer Mother 63    Lung (Smoker)   . Heart disease Maternal Grandfather   . Cancer Maternal Grandfather     Prostate     No Known Allergies   Immunization History  Administered Date(s) Administered  . Tdap 05/19/2011       Objective:   Physical Exam  Constitutional: He is oriented to person, place, and time. He appears well-developed and  well-nourished. No distress.  Eyes: No scleral icterus.  Neck: No thyromegaly present.  Cardiovascular: Normal rate and regular rhythm.   Pulmonary/Chest: Effort normal and breath sounds normal.  Musculoskeletal: He exhibits no edema and no tenderness.  Neurological: He is alert and oriented to person, place, and time.  Skin: Skin is warm and dry.  Psychiatric: He has a normal mood and affect. His behavior is normal. Judgment and thought content normal.      Assessment & Plan:    Maribel was seen today for medication management.  Diagnoses and associated orders for this visit:  Concentration deficit  - lisdexamfetamine (VYVANSE) 50 MG capsule; Take 1 capsule (50 mg total) by mouth daily. Refilled x 4 months.

## 2014-05-05 ENCOUNTER — Other Ambulatory Visit: Payer: Self-pay | Admitting: Family Medicine

## 2014-05-05 DIAGNOSIS — R41840 Attention and concentration deficit: Secondary | ICD-10-CM

## 2014-05-05 DIAGNOSIS — N3090 Cystitis, unspecified without hematuria: Secondary | ICD-10-CM

## 2014-05-05 MED ORDER — CIPROFLOXACIN HCL 500 MG PO TABS: 500.0000 mg | ORAL_TABLET | Freq: Two times a day (BID) | ORAL | Status: AC

## 2014-05-05 MED ORDER — LISDEXAMFETAMINE DIMESYLATE 50 MG PO CAPS: 50.0000 mg | ORAL_CAPSULE | Freq: Every day | ORAL | Status: AC

## 2014-05-05 MED ORDER — PHENAZOPYRIDINE HCL 200 MG PO TABS: 200.0000 mg | ORAL_TABLET | Freq: Three times a day (TID) | ORAL | Status: AC

## 2020-04-12 ENCOUNTER — Encounter: Payer: Self-pay | Admitting: Gastroenterology

## 2020-06-01 ENCOUNTER — Ambulatory Visit (AMBULATORY_SURGERY_CENTER): Payer: Self-pay | Admitting: *Deleted

## 2020-06-01 ENCOUNTER — Other Ambulatory Visit: Payer: Self-pay

## 2020-06-01 VITALS — Ht 68.0 in | Wt 166.0 lb

## 2020-06-01 DIAGNOSIS — Z1211 Encounter for screening for malignant neoplasm of colon: Secondary | ICD-10-CM

## 2020-06-01 MED ORDER — PEG 3350-KCL-NA BICARB-NACL 420 G PO SOLR: 4000.0000 mL | Freq: Once | ORAL | 0 refills | Status: AC

## 2020-06-01 NOTE — Progress Notes (Signed)

## 2020-06-07 ENCOUNTER — Encounter: Payer: Self-pay | Admitting: Gastroenterology

## 2020-06-22 ENCOUNTER — Encounter: Payer: Self-pay | Admitting: Gastroenterology

## 2020-06-22 ENCOUNTER — Ambulatory Visit (AMBULATORY_SURGERY_CENTER): Payer: BLUE CROSS/BLUE SHIELD | Admitting: Gastroenterology

## 2020-06-22 ENCOUNTER — Other Ambulatory Visit: Payer: Self-pay

## 2020-06-22 VITALS — BP 122/88 | HR 62 | Temp 97.1°F | Resp 9 | Ht 68.0 in | Wt 166.0 lb

## 2020-06-22 DIAGNOSIS — K635 Polyp of colon: Secondary | ICD-10-CM

## 2020-06-22 DIAGNOSIS — D124 Benign neoplasm of descending colon: Secondary | ICD-10-CM

## 2020-06-22 DIAGNOSIS — Z1211 Encounter for screening for malignant neoplasm of colon: Secondary | ICD-10-CM

## 2020-06-22 MED ORDER — SODIUM CHLORIDE 0.9 % IV SOLN: 500.0000 mL | Freq: Once | INTRAVENOUS | Status: DC

## 2020-06-22 NOTE — Progress Notes (Signed)
Pt's states no medical or surgical changes since previsit or office visit.  SF vitals and AG IV.

## 2020-06-22 NOTE — Patient Instructions (Addendum)
Handout was given to you on polyps. You may resume your current medications today. Await biopsy results. Please call if any questions or concerns.      .YOU HAD AN ENDOSCOPIC PROCEDURE TODAY AT [facility]:   Refer to the procedure report that was given to you for any specific questions about what was found during the examination.  If the procedure report does not answer your questions, please call your gastroenterologist to clarify.  If you requested that your care partner not be given the details of your procedure findings, then the procedure report has been included in a sealed envelope for you to review at your convenience later.  YOU SHOULD EXPECT: Some feelings of bloating in the abdomen. Passage of more gas than usual.  Walking can help get rid of the air that was put into your GI tract during the procedure and reduce the bloating. If you had a lower endoscopy (such as a colonoscopy or flexible sigmoidoscopy) you may notice spotting of blood in your stool or on the toilet paper. If you underwent a bowel prep for your procedure, you may not have a normal bowel movement for a few days.  Please Note:  You might notice some irritation and congestion in your nose or some drainage.  This is from the oxygen used during your procedure.  There is no need for concern and it should clear up in a day or so.  SYMPTOMS TO REPORT IMMEDIATELY:   Following lower endoscopy (colonoscopy or flexible sigmoidoscopy):  Excessive amounts of blood in the stool  Significant tenderness or worsening of abdominal pains  Swelling of the abdomen that is new, acute  Fever of 100F or higher   For urgent or emergent issues, a gastroenterologist can be reached at any hour by calling [phone]. Do not use MyChart messaging for urgent concerns.    DIET:  We do recommend a small meal at first, but then you may proceed to your regular diet.  Drink plenty of fluids but you should avoid alcoholic  beverages for 24 hours.  ACTIVITY:  You should plan to take it easy for the rest of today and you should NOT DRIVE or use heavy machinery until tomorrow (because of the sedation medicines used during the test).    FOLLOW UP: Our staff will call the number listed on your records 48-72 hours following your procedure to check on you and address any questions or concerns that you may have regarding the information given to you following your procedure. If we do not reach you, we will leave a message.  We will attempt to reach you two times.  During this call, we will ask if you have developed any symptoms of COVID 19. If you develop any symptoms (ie: fever, flu-like symptoms, shortness of breath, cough etc.) before then, please call [phone].  If you test positive for Covid 19 in the 2 weeks post procedure, please call and report this information to [place].    If any biopsies were taken you will be contacted by phone or by letter within the next 1-3 weeks.  Please call us at [phone] if you have not heard about the biopsies in 3 weeks.    SIGNATURES/CONFIDENTIALITY: You and/or your care partner have signed paperwork which will be entered into your electronic medical record.  These signatures attest to the fact that that the information above on your After Visit Summary has been reviewed and is understood.  Full responsibility of the  confidentiality of this discharge information lies with you and/or your care-partner.

## 2020-06-22 NOTE — Progress Notes (Signed)
To PACU, VSS. Report to [email or web address] 

## 2020-06-22 NOTE — Progress Notes (Signed)
Called to room to assist during endoscopic procedure.  Patient ID and intended procedure confirmed with present staff. Received instructions for my participation in the procedure from the performing physician.  

## 2020-06-22 NOTE — Op Note (Signed)
[facility] Patient Name: Luis Chase Procedure Date: 06/22/2020 7:13 AM MRN: [id number] Endoscopist: Ladene Artist , MD Age: 44 Referring MD:  Date of Birth: Sep 18, 1976 Gender: Male Account #: [id number] Procedure:                Colonoscopy Indications:              Screening for colorectal malignant neoplasm Medicines:                Monitored Anesthesia Care Procedure:                Pre-Anesthesia Assessment:                           - Prior to the procedure, a History and Physical                            was performed, and patient medications and                            allergies were reviewed. The patient's tolerance of                            previous anesthesia was also reviewed. The risks                            and benefits of the procedure and the sedation                            options and risks were discussed with the patient.                            All questions were answered, and informed consent                            was obtained. Prior Anticoagulants: The patient has                            taken no previous anticoagulant or antiplatelet                            agents. ASA Grade Assessment: II - A patient with                            mild systemic disease. After reviewing the risks                            and benefits, the patient was deemed in                            satisfactory condition to undergo the procedure.                           After obtaining informed consent, the colonoscope  was passed under direct vision. Throughout the                            procedure, the patient's blood pressure, pulse, and                            oxygen saturations were monitored continuously. The                            Colonoscope was introduced through the anus and                            advanced to the the cecum, identified by                            appendiceal orifice and  ileocecal valve. The                            ileocecal valve, appendiceal orifice, and rectum                            were photographed. The quality of the bowel                            preparation was excellent. The colonoscopy was                            performed without difficulty. The patient tolerated                            the procedure well. Scope In: 8:07:48 AM Scope Out: 8:27:37 AM Scope Withdrawal Time: 0 hours 15 minutes 50 seconds  Total Procedure Duration: 0 hours 19 minutes 49 seconds  Findings:                 The perianal and digital rectal examinations were                            normal.                           A 7 mm polyp was found in the descending colon. The                            polyp was sessile. The polyp was removed with a                            cold snare. Resection and retrieval were complete.                           The exam was otherwise without abnormality on                            direct and retroflexion views. Complications:  No immediate complications. Estimated blood loss:                            None. Estimated Blood Loss:     Estimated blood loss: none. Impression:               - One 7 mm polyp in the descending colon, removed                            with a cold snare. Resected and retrieved.                           - The examination was otherwise normal on direct                            and retroflexion views. Recommendation:           - Repeat colonoscopy after studies are complete for                            surveillance based on pathology results.                           - Patient has a contact number available for                            emergencies. The signs and symptoms of potential                            delayed complications were discussed with the                            patient. Return to normal activities tomorrow.                            Written discharge  instructions were provided to the                            patient.                           - Resume previous diet.                           - Continue present medications.                           - Await pathology results. Ladene Artist, MD 06/22/2020 8:29:47 AM This report has been signed electronically.

## 2020-06-22 NOTE — Progress Notes (Signed)
No problems noted in the recovery room. maw 

## 2020-06-26 ENCOUNTER — Telehealth: Payer: Self-pay

## 2020-06-26 NOTE — Telephone Encounter (Signed)
  Follow up Call-  Call back number 06/22/2020  Post procedure Call Back phone  # [phone]  Permission to leave phone message Yes  Some recent data might be hidden     Patient questions:  Do you have a fever, pain , or abdominal swelling? No. Pain Score  0 *  Have you tolerated food without any problems? Yes.    Have you been able to return to your normal activities? Yes.    Do you have any questions about your discharge instructions: Diet   No. Medications  No. Follow up visit  No.  Do you have questions or concerns about your Care? No.  Actions: * If pain score is 4 or above: No action needed, pain <4.  1. Have you developed a fever since your procedure? no  2.   Have you had an respiratory symptoms (SOB or cough) since your procedure? no  3.   Have you tested positive for COVID 19 since your procedure no  4.   Have you had any family members/close contacts diagnosed with the COVID 19 since your procedure?  no   If yes to any of these questions please route to Joylene John, RN and Joella Prince, RN

## 2020-07-02 ENCOUNTER — Encounter: Payer: Self-pay | Admitting: Gastroenterology
